# Patient Record
Sex: Female | Born: 1960 | Race: White | Hispanic: No | Smoking: Never smoker
Health system: Southern US, Community
[De-identification: ages and names within clinical notes are randomized; demographics above are authoritative.]

## PROBLEM LIST (undated history)

## (undated) DIAGNOSIS — I4510 Unspecified right bundle-branch block: Secondary | ICD-10-CM

## (undated) DIAGNOSIS — F419 Anxiety disorder, unspecified: Secondary | ICD-10-CM

## (undated) DIAGNOSIS — Z974 Presence of external hearing-aid: Secondary | ICD-10-CM

## (undated) DIAGNOSIS — F32A Depression, unspecified: Secondary | ICD-10-CM

## (undated) DIAGNOSIS — K2270 Barrett's esophagus without dysplasia: Secondary | ICD-10-CM

## (undated) DIAGNOSIS — I10 Essential (primary) hypertension: Secondary | ICD-10-CM

## (undated) DIAGNOSIS — C50919 Malignant neoplasm of unspecified site of unspecified female breast: Secondary | ICD-10-CM

## (undated) DIAGNOSIS — K219 Gastro-esophageal reflux disease without esophagitis: Secondary | ICD-10-CM

## (undated) HISTORY — PX: BREAST LUMPECTOMY: SHX2

---

## 2002-12-28 ENCOUNTER — Other Ambulatory Visit: Payer: Self-pay

## 2002-12-29 ENCOUNTER — Other Ambulatory Visit: Payer: Self-pay

## 2003-05-30 ENCOUNTER — Other Ambulatory Visit: Payer: Self-pay

## 2003-06-18 ENCOUNTER — Other Ambulatory Visit: Payer: Self-pay

## 2004-02-09 DIAGNOSIS — C50919 Malignant neoplasm of unspecified site of unspecified female breast: Secondary | ICD-10-CM

## 2004-02-09 HISTORY — PX: CERVICAL SPINE SURGERY: SHX589

## 2004-02-09 HISTORY — DX: Malignant neoplasm of unspecified site of unspecified female breast: C50.919

## 2004-02-11 ENCOUNTER — Ambulatory Visit: Payer: Self-pay | Admitting: Internal Medicine

## 2004-04-17 ENCOUNTER — Ambulatory Visit: Payer: Self-pay | Admitting: Internal Medicine

## 2004-04-23 ENCOUNTER — Ambulatory Visit: Payer: Self-pay | Admitting: Internal Medicine

## 2004-06-10 ENCOUNTER — Ambulatory Visit: Payer: Self-pay | Admitting: General Surgery

## 2004-06-12 ENCOUNTER — Ambulatory Visit: Payer: Self-pay | Admitting: Radiation Oncology

## 2004-08-05 ENCOUNTER — Ambulatory Visit: Payer: Self-pay | Admitting: General Surgery

## 2004-08-07 ENCOUNTER — Ambulatory Visit: Payer: Self-pay | Admitting: Radiation Oncology

## 2004-08-08 ENCOUNTER — Ambulatory Visit: Payer: Self-pay | Admitting: Radiation Oncology

## 2004-09-08 ENCOUNTER — Ambulatory Visit: Payer: Self-pay | Admitting: Radiation Oncology

## 2004-10-07 ENCOUNTER — Other Ambulatory Visit: Payer: Self-pay

## 2004-10-09 ENCOUNTER — Ambulatory Visit: Payer: Self-pay | Admitting: Radiation Oncology

## 2004-11-08 ENCOUNTER — Ambulatory Visit: Payer: Self-pay | Admitting: Radiation Oncology

## 2004-12-09 ENCOUNTER — Ambulatory Visit: Payer: Self-pay | Admitting: Radiation Oncology

## 2004-12-18 ENCOUNTER — Ambulatory Visit: Payer: Self-pay | Admitting: Internal Medicine

## 2005-01-08 ENCOUNTER — Ambulatory Visit: Payer: Self-pay | Admitting: Radiation Oncology

## 2005-01-08 ENCOUNTER — Inpatient Hospital Stay (HOSPITAL_COMMUNITY): Admission: RE | Admit: 2005-01-08 | Discharge: 2005-01-10 | Payer: Self-pay | Admitting: Neurosurgery

## 2005-02-19 ENCOUNTER — Ambulatory Visit: Payer: Self-pay | Admitting: Internal Medicine

## 2005-02-26 ENCOUNTER — Ambulatory Visit: Payer: Self-pay | Admitting: Radiation Oncology

## 2005-03-11 ENCOUNTER — Ambulatory Visit: Payer: Self-pay | Admitting: Internal Medicine

## 2005-04-08 ENCOUNTER — Ambulatory Visit: Payer: Self-pay | Admitting: Internal Medicine

## 2005-05-09 ENCOUNTER — Ambulatory Visit: Payer: Self-pay | Admitting: Internal Medicine

## 2005-06-08 ENCOUNTER — Ambulatory Visit: Payer: Self-pay | Admitting: Internal Medicine

## 2005-07-09 ENCOUNTER — Ambulatory Visit: Payer: Self-pay | Admitting: Internal Medicine

## 2005-08-08 ENCOUNTER — Ambulatory Visit: Payer: Self-pay | Admitting: Internal Medicine

## 2005-09-14 ENCOUNTER — Ambulatory Visit: Payer: Self-pay | Admitting: Internal Medicine

## 2005-10-09 ENCOUNTER — Ambulatory Visit: Payer: Self-pay | Admitting: Internal Medicine

## 2005-11-08 ENCOUNTER — Ambulatory Visit: Payer: Self-pay | Admitting: Internal Medicine

## 2005-12-14 ENCOUNTER — Ambulatory Visit: Payer: Self-pay | Admitting: Internal Medicine

## 2006-01-08 ENCOUNTER — Ambulatory Visit: Payer: Self-pay | Admitting: Internal Medicine

## 2006-02-08 ENCOUNTER — Ambulatory Visit: Payer: Self-pay | Admitting: Internal Medicine

## 2006-03-18 ENCOUNTER — Ambulatory Visit: Payer: Self-pay | Admitting: Internal Medicine

## 2006-04-09 ENCOUNTER — Ambulatory Visit: Payer: Self-pay | Admitting: Internal Medicine

## 2006-05-10 ENCOUNTER — Ambulatory Visit: Payer: Self-pay | Admitting: Internal Medicine

## 2006-06-09 ENCOUNTER — Ambulatory Visit: Payer: Self-pay | Admitting: Internal Medicine

## 2006-07-10 ENCOUNTER — Ambulatory Visit: Payer: Self-pay | Admitting: Internal Medicine

## 2006-08-03 ENCOUNTER — Ambulatory Visit: Payer: Self-pay | Admitting: Unknown Physician Specialty

## 2006-08-09 ENCOUNTER — Ambulatory Visit: Payer: Self-pay | Admitting: Internal Medicine

## 2006-09-09 ENCOUNTER — Ambulatory Visit: Payer: Self-pay | Admitting: Internal Medicine

## 2006-10-06 ENCOUNTER — Ambulatory Visit: Payer: Self-pay | Admitting: Internal Medicine

## 2006-10-10 ENCOUNTER — Ambulatory Visit: Payer: Self-pay | Admitting: Internal Medicine

## 2006-12-28 ENCOUNTER — Ambulatory Visit: Payer: Self-pay | Admitting: Internal Medicine

## 2007-01-09 ENCOUNTER — Ambulatory Visit: Payer: Self-pay | Admitting: Internal Medicine

## 2007-04-09 ENCOUNTER — Ambulatory Visit: Payer: Self-pay | Admitting: Internal Medicine

## 2007-04-12 ENCOUNTER — Ambulatory Visit: Payer: Self-pay | Admitting: Internal Medicine

## 2007-05-10 ENCOUNTER — Ambulatory Visit: Payer: Self-pay | Admitting: Internal Medicine

## 2007-06-21 ENCOUNTER — Ambulatory Visit: Payer: Self-pay | Admitting: Internal Medicine

## 2008-05-20 ENCOUNTER — Emergency Department: Payer: Self-pay | Admitting: Emergency Medicine

## 2008-09-11 ENCOUNTER — Ambulatory Visit: Payer: Self-pay

## 2011-01-06 IMAGING — CT CT HEAD WITHOUT CONTRAST
2 series · 16 of 30 positions shown, 20 images · non-contrast
Comparison: none

REASON FOR EXAM: headache, dizzy, hx breast ca
COMMENTS:   LMP: d/c after chemo

PROCEDURE:     CT  - CT HEAD WITHOUT CONTRAST  - May 20, 2008  [DATE]
RESULT:
HISTORY: Headache, breast cancer.
COMPARISON STUDIES:  MRI of brain of 08/03/2006.
PROCEDURE AND FINDINGS:  Nonenhanced head CT is obtained.
No intra-axial or extra-axial pathologic fluid or blood collections are
identified. No mass lesions are noted. There is no hydrocephalus. No acute
bony abnormality identified.

[Series 2: without · axial · non-contrast · 0.39mm/px · z∈[+670,+790]mm · 13 of 30 slices shown, 17 images]
[im 3/30  brain]
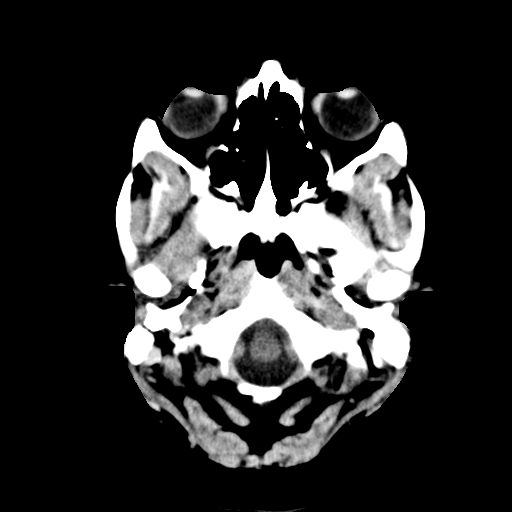
[im 3/30  bone]
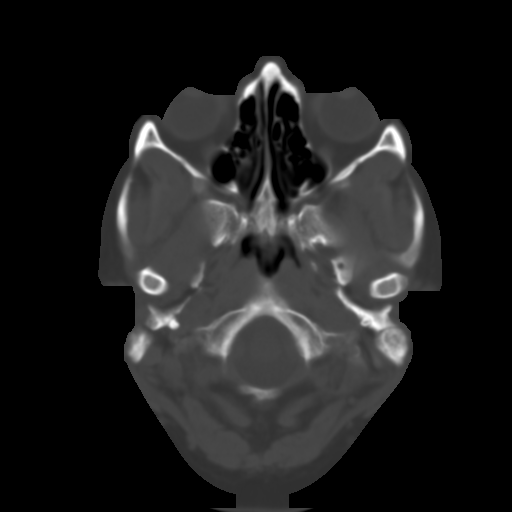
[im 5/30  brain]
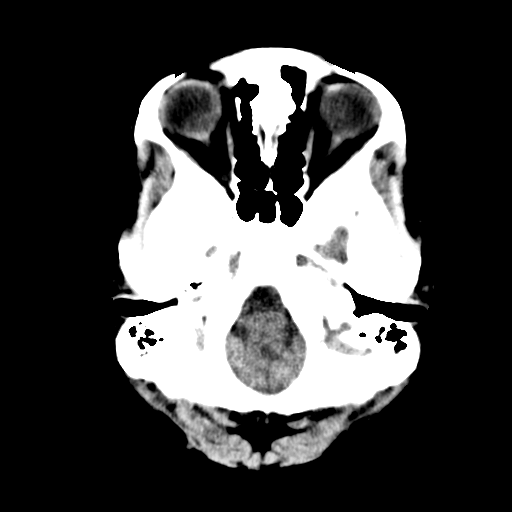
[im 7/30  brain]
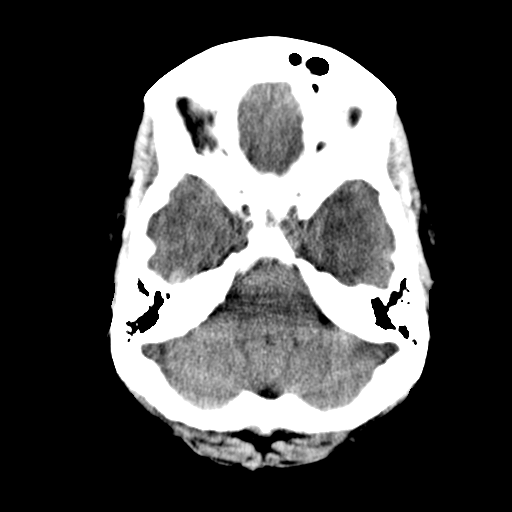
[im 9/30  brain]
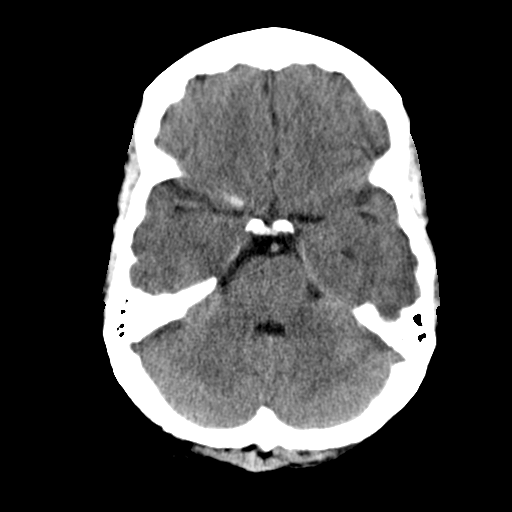
[im 11/30  brain]
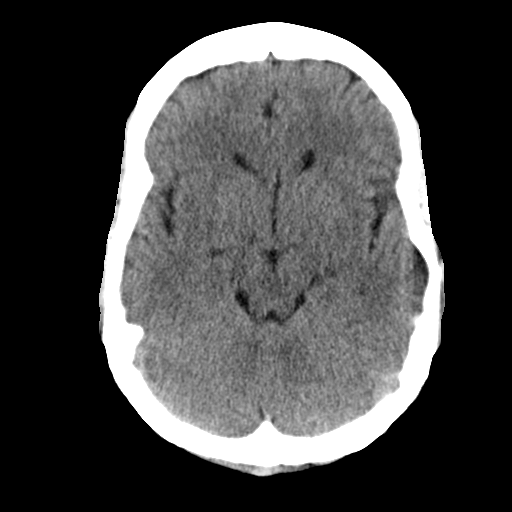
[im 11/30  bone]
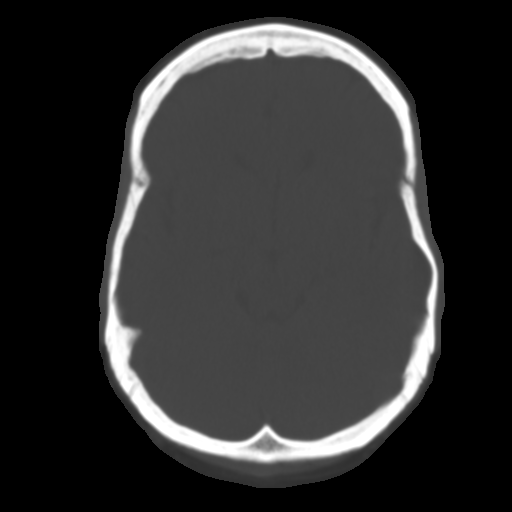
[im 13/30  brain]
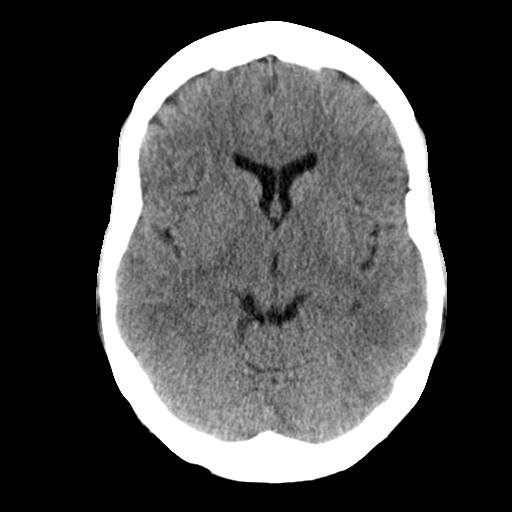
[im 15/30  brain]
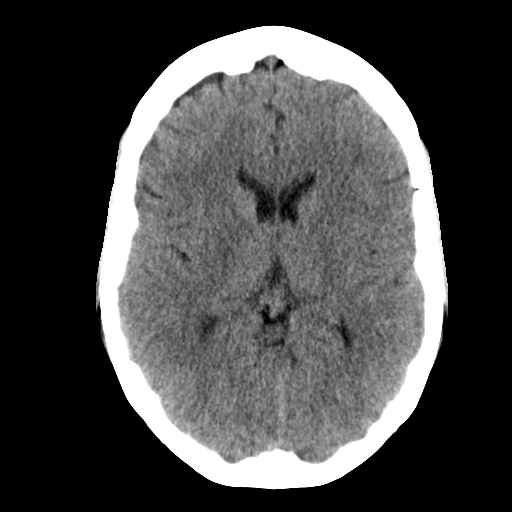
[im 17/30  brain]
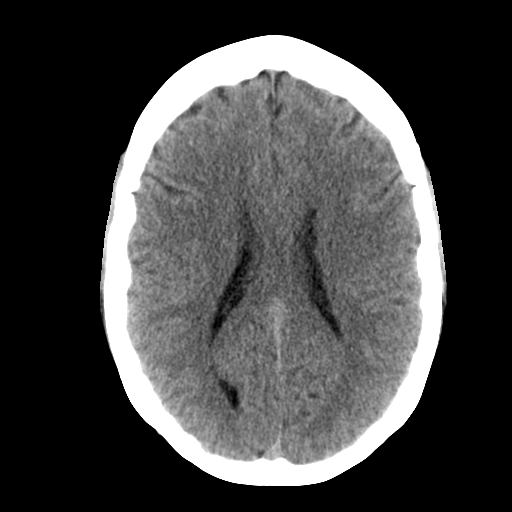
[im 19/30  brain]
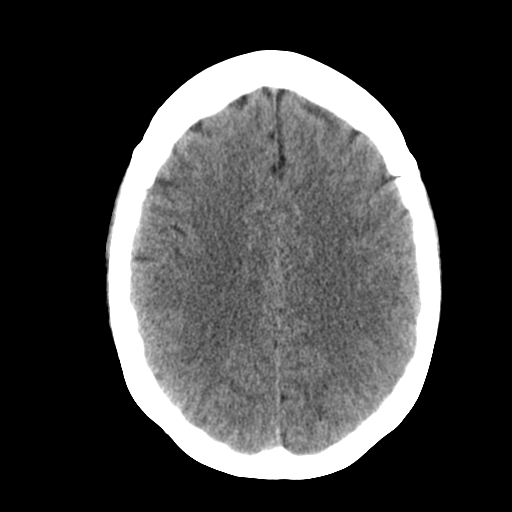
[im 19/30  bone]
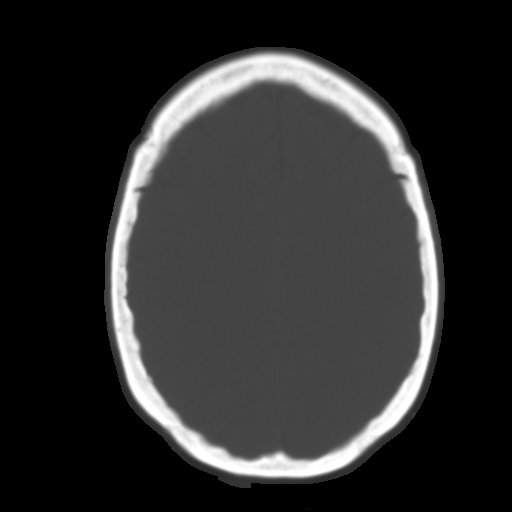
[im 21/30  brain]
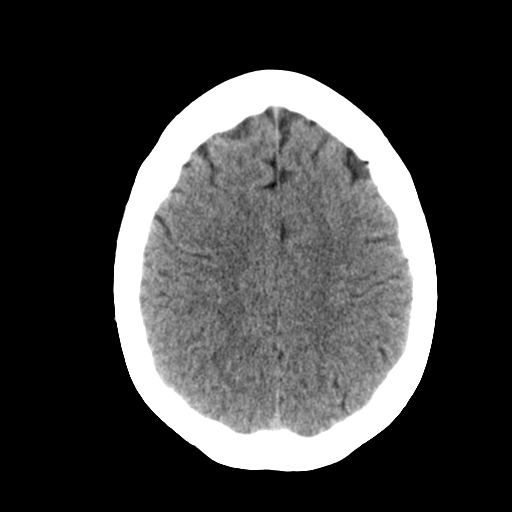
[im 23/30  brain]
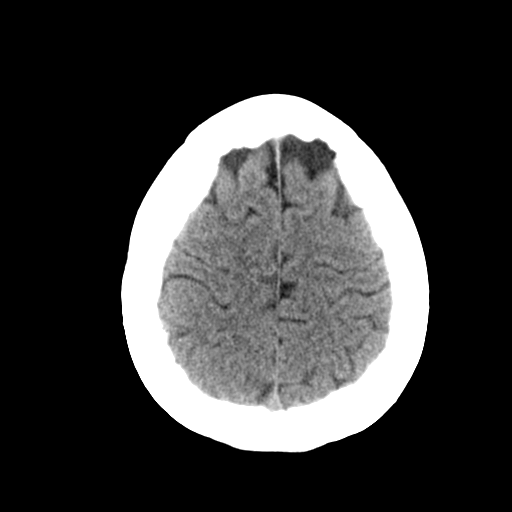
[im 25/30  brain]
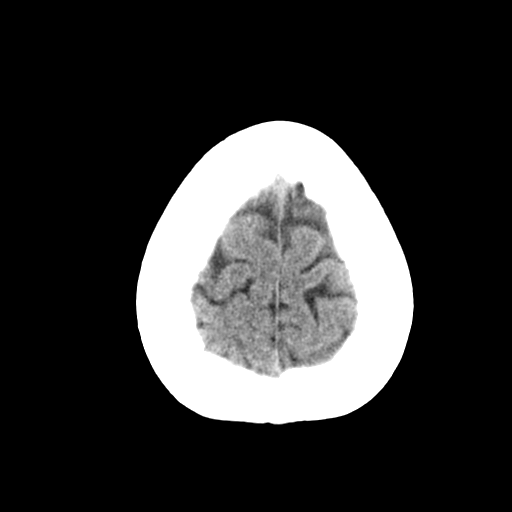
[im 27/30  brain]
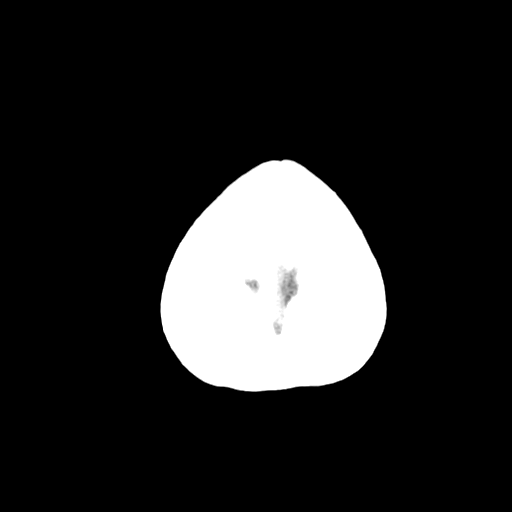
[im 27/30  bone]
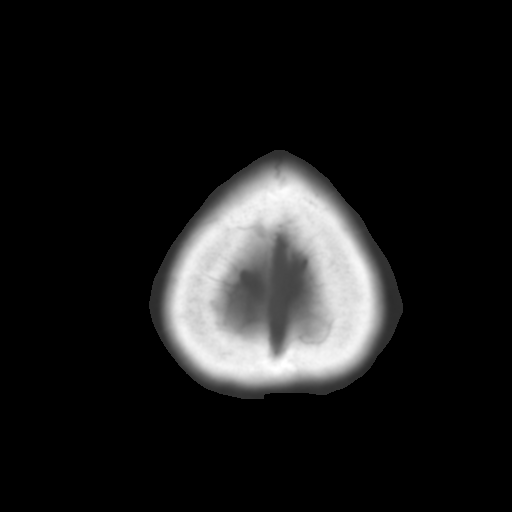

[Series 3: bone · axial · 0.39mm/px · z∈[+670,+710]mm · 3 of 30 slices shown]
[im 3/30  bone]
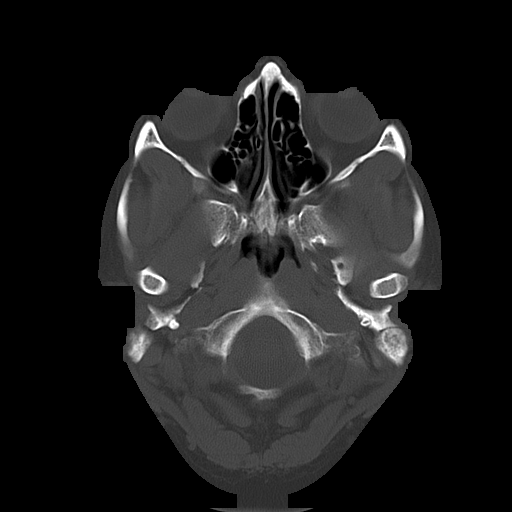
[im 7/30  bone]
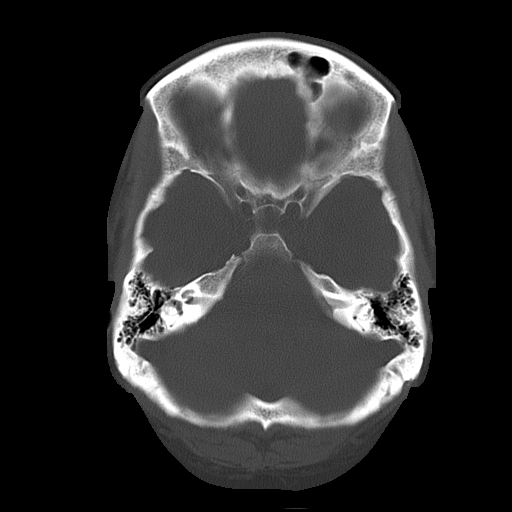
[im 11/30  bone]
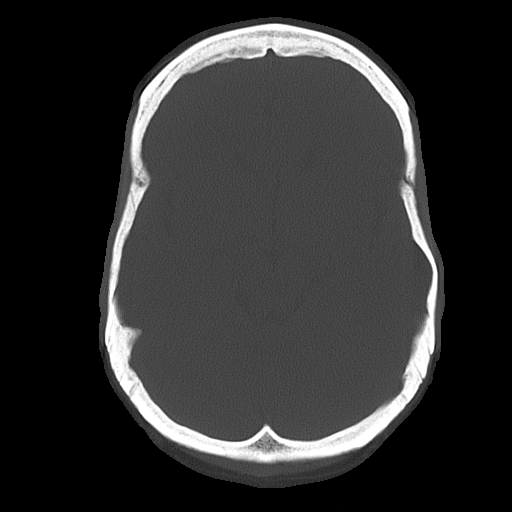

[16 of 30 positions shown; findings below may reference images not displayed]

IMPRESSION: No acute abnormality.

This report was phoned to the Emergency Room physician at the time of the
study.

## 2021-01-15 ENCOUNTER — Encounter: Payer: Self-pay | Admitting: Emergency Medicine

## 2021-01-15 ENCOUNTER — Ambulatory Visit
Admission: EM | Admit: 2021-01-15 | Discharge: 2021-01-15 | Disposition: A | Discharge status: Home / Self Care | Payer: Non-veteran care

## 2021-01-15 ENCOUNTER — Other Ambulatory Visit: Payer: Self-pay

## 2021-01-15 DIAGNOSIS — B349 Viral infection, unspecified: Secondary | ICD-10-CM | POA: Diagnosis not present

## 2021-01-15 DIAGNOSIS — R030 Elevated blood-pressure reading, without diagnosis of hypertension: Secondary | ICD-10-CM

## 2021-01-15 HISTORY — DX: Depression, unspecified: F32.A

## 2021-01-15 HISTORY — DX: Malignant neoplasm of unspecified site of unspecified female breast: C50.919

## 2021-01-15 HISTORY — DX: Anxiety disorder, unspecified: F41.9

## 2021-01-15 MED ORDER — BENZONATATE 100 MG PO CAPS: 100.0000 mg | ORAL_CAPSULE | Freq: Three times a day (TID) | ORAL | 0 refills | Status: DC | PRN

## 2021-01-15 NOTE — Discharge Instructions (Addendum)
Your COVID and Flu tests are pending.  You should self quarantine until the test results are back.  Take Tylenol as needed for fever or discomfort.  Rest and keep yourself hydrated.  Follow-up with your primary care provider if your symptoms are not improving.    Your blood pressure is elevated today at 167/86.  Please have this rechecked by your primary care provider in 2-4 weeks.

## 2021-01-15 NOTE — ED Triage Notes (Signed)
Pt c/o cough, chest congestion, ST, fatigue, and ears feel clogged. At home covid test was negative.

## 2021-01-15 NOTE — ED Provider Notes (Addendum)
[name]    CSN: [id number] Arrival date & time: 01/15/21  6599      History   Chief Complaint Chief Complaint  Patient presents with   Cough   Sore Throat    HPI Robin Zuniga is a 60 y.o. female.  Patient presents with fatigue, earache, congestion, postnasal drip, sore throat, cough x 3 days.  No fever, chills, drainage, shortness of breath, vomiting, diarrhea, or other symptoms.  Treatment at home with Tylenol.  Her medical history includes breast cancer, anxiety, depression.  The history is provided by the patient.   Past Medical History:  Diagnosis Date   Anxiety    Breast cancer ([place])    Depression     There are no problems to display for this patient.   Past Surgical History:  Procedure Laterality Date   BREAST LUMPECTOMY Right     OB History   No obstetric history on file.      Home Medications    Prior to Admission medications   Medication Sig Start Date End Date Taking? Authorizing Provider  ARIPiprazole (ABILIFY) 10 MG tablet TAKE ONE-HALF TABLET BY MOUTH DAILY FOR MOOD 05/13/20 05/14/21 Yes [provider]  benzonatate (TESSALON) 100 MG capsule Take 1 capsule (100 mg total) by mouth every 8 (eight) hours as needed for cough. 01/15/21  Yes Sharion Balloon, NP  buPROPion (WELLBUTRIN XL) 300 MG 24 hr tablet Take 1 tablet by mouth every morning. 05/19/20 01/29/21 Yes [provider]  busPIRone (BUSPAR) 10 MG tablet TAKE ONE TABLET BY MOUTH TWICE A DAY FOR ANXIETY 01/29/20 01/29/21 Yes [provider]  Carboxymethylcellulose Sodium 0.25 % SOLN INSTILL 1 DROP IN BOTH EYES FOUR TIMES A DAY AS NEEDED 08/01/20  Yes [provider]  clotrimazole (GYNE-LOTRIMIN) 1 % vaginal cream INSERT 1 APPLICATORFUL INTO VAGINA DAILY TO BE ADMINISTERED AT BEDTIME. 04/22/20  Yes [provider]  hydrOXYzine (ATARAX) 10 MG tablet TAKE ONE TABLET BY MOUTH DAILY AS NEEDED FOR ANXIETY 10/27/20 10/28/21 Yes [provider]   ibuprofen (ADVIL) 800 MG tablet TAKE ONE TABLET BY MOUTH THREE TIMES A DAY AS NEEDED FOR PAIN (TAKE WITH FOOD) 05/22/20  Yes [provider]  loratadine (CLARITIN) 10 MG tablet Take 1 tablet by mouth daily. 11/13/20  Yes [provider]  methylPREDNISolone (MEDROL DOSEPAK) 4 MG TBPK tablet TAKE TABLETS BY MOUTH AS DIRECTED PAIN ON PACKAGE INSTRUCTIONS 01/12/21  Yes [provider]  omeprazole (PRILOSEC) 40 MG capsule TAKE ONE CAPSULE BY MOUTH TWICE A DAY (TAKE ON AN EMPTY STOMACH 30 MINUTES PRIOR TO A MEAL) 09/17/20 02/26/21 Yes [provider]  traZODone (DESYREL) 50 MG tablet TAKE ONE TO TWO TABLETS BY MOUTH AT BEDTIME AS NEEDED FOR INSOMNIA 10/27/20 10/28/21 Yes [provider]    Family History No family history on file.  Social History Social History   Tobacco Use   Smoking status: Never   Smokeless tobacco: Never  Vaping Use   Vaping Use: Never used  Substance Use Topics   Alcohol use: Yes   Drug use: Never     Allergies   Cefazolin, Sulfa antibiotics, and Zonisamide   Review of Systems Review of Systems  Constitutional:  Negative for chills and fever.  HENT:  Positive for congestion, ear pain, postnasal drip and sore throat.   Respiratory:  Positive for cough. Negative for shortness of breath.   Cardiovascular:  Negative for chest pain and palpitations.  Gastrointestinal:  Negative for diarrhea and vomiting.  Skin:  Negative for color change and rash.  All other systems reviewed and are negative.   Physical Exam Triage Vital Signs ED Triage Vitals  Enc Vitals Group     BP      Pulse      Resp      Temp      Temp src      SpO2      Weight      Height      Head Circumference      Peak Flow      Pain Score      Pain Loc      Pain Edu?      Excl. in [place]?    No data found.  Updated Vital Signs BP (!) 167/86 (BP Location: Left Arm)   Pulse 95   Temp 98.7 F (37.1 C) (Oral)   Resp 18   SpO2 96%   Visual  Acuity Right Eye Distance:   Left Eye Distance:   Bilateral Distance:    Right Eye Near:   Left Eye Near:    Bilateral Near:     Physical Exam Vitals and nursing note reviewed.  Constitutional:      General: She is not in acute distress.    Appearance: She is well-developed.  HENT:     Right Ear: Tympanic membrane normal.     Left Ear: Tympanic membrane normal.     Nose: Nose normal.     Mouth/Throat:     Mouth: Mucous membranes are moist.     Pharynx: Oropharynx is clear.  Cardiovascular:     Rate and Rhythm: Normal rate and regular rhythm.     Heart sounds: Normal heart sounds.  Pulmonary:     Effort: Pulmonary effort is normal. No respiratory distress.     Breath sounds: Normal breath sounds.  Musculoskeletal:     Cervical back: Neck supple.  Skin:    General: Skin is warm and dry.  Neurological:     Mental Status: She is alert.  Psychiatric:        Mood and Affect: Mood normal.        Behavior: Behavior normal.     UC Treatments / Results  Labs (all labs ordered are listed, but only abnormal results are displayed) Labs Reviewed  COVID-19, FLU A+B NAA    EKG   Radiology No results found.  Procedures Procedures (including critical care time)  Medications Ordered in UC Medications - No data to display  Initial Impression / Assessment and Plan / UC Course  I have reviewed the triage vital signs and the nursing notes.  Pertinent labs & imaging results that were available during my care of the patient were reviewed by me and considered in my medical decision making (see chart for details).   Viral illness. Elevated blood pressure reading. COVID and Flu pending.  Instructed patient to self quarantine per CDC guidelines.  Treating cough with Tessalon Perles.  Discussed symptomatic treatment including Tylenol, rest, hydration.  Instructed patient to follow up with PCP if symptoms are not improving.  Also discussed that her blood pressure is elevated today and  needs to be rechecked by her PCP in 2 to 4 weeks.  Patient agrees to plan of care.    Final Clinical Impressions(s) / UC Diagnoses   Final diagnoses:  Viral illness  Elevated blood pressure reading     Discharge Instructions      Your COVID and Flu tests are pending.  You should self quarantine until the test results are back.  Take Tylenol as needed for fever or discomfort.  Rest and keep yourself hydrated.  Follow-up with your primary care provider if your symptoms are not improving.    Your blood pressure is elevated today at 167/86.  Please have this rechecked by your primary care provider in 2-4 weeks.          ED Prescriptions     Medication Sig Dispense Auth. Provider   benzonatate (TESSALON) 100 MG capsule Take 1 capsule (100 mg total) by mouth every 8 (eight) hours as needed for cough. 21 capsule Sharion Balloon, NP      PDMP not reviewed this encounter.   Sharion Balloon, NP 01/15/21 1025    Sharion Balloon, NP 01/15/21 1027

## 2021-01-16 LAB — COVID-19, FLU A+B NAA
Influenza A, NAA: NOT DETECTED
Influenza B, NAA: NOT DETECTED
SARS-CoV-2, NAA: NOT DETECTED

## 2021-12-14 ENCOUNTER — Other Ambulatory Visit: Payer: Self-pay

## 2021-12-14 ENCOUNTER — Emergency Department
Admission: EM | Admit: 2021-12-14 | Discharge: 2021-12-14 | Disposition: A | Discharge status: Home / Self Care | Payer: No Typology Code available for payment source | Attending: Emergency Medicine | Admitting: Emergency Medicine

## 2021-12-14 ENCOUNTER — Emergency Department: Payer: No Typology Code available for payment source

## 2021-12-14 ENCOUNTER — Ambulatory Visit
Admission: EM | Admit: 2021-12-14 | Discharge: 2021-12-14 | Disposition: A | Discharge status: Other Acute Inpatient Hospital | Payer: Non-veteran care

## 2021-12-14 ENCOUNTER — Encounter: Payer: Self-pay | Admitting: *Deleted

## 2021-12-14 DIAGNOSIS — I10 Essential (primary) hypertension: Secondary | ICD-10-CM | POA: Diagnosis not present

## 2021-12-14 DIAGNOSIS — R519 Headache, unspecified: Secondary | ICD-10-CM | POA: Diagnosis present

## 2021-12-14 LAB — CBC WITH DIFFERENTIAL/PLATELET
Abs Immature Granulocytes: 0.02 10*3/uL (ref 0.00–0.07)
Basophils Absolute: 0.1 10*3/uL (ref 0.0–0.1)
Basophils Relative: 1 %
Eosinophils Absolute: 0.5 10*3/uL (ref 0.0–0.5)
Eosinophils Relative: 7 %
HCT: 42.1 % (ref 36.0–46.0)
Hemoglobin: 14.3 g/dL (ref 12.0–15.0)
Immature Granulocytes: 0 %
Lymphocytes Relative: 25 %
Lymphs Abs: 2 10*3/uL (ref 0.7–4.0)
MCH: 29.1 pg (ref 26.0–34.0)
MCHC: 34 g/dL (ref 30.0–36.0)
MCV: 85.7 fL (ref 80.0–100.0)
Monocytes Absolute: 0.5 10*3/uL (ref 0.1–1.0)
Monocytes Relative: 6 %
Neutro Abs: 4.8 10*3/uL (ref 1.7–7.7)
Neutrophils Relative %: 61 %
Platelets: 235 10*3/uL (ref 150–400)
RBC: 4.91 MIL/uL (ref 3.87–5.11)
RDW: 12.6 % (ref 11.5–15.5)
WBC: 8 10*3/uL (ref 4.0–10.5)
nRBC: 0 % (ref 0.0–0.2)

## 2021-12-14 LAB — COMPREHENSIVE METABOLIC PANEL
ALT: 16 U/L (ref 0–44)
AST: 15 U/L (ref 15–41)
Albumin: 4.4 g/dL (ref 3.5–5.0)
Alkaline Phosphatase: 86 U/L (ref 38–126)
Anion gap: 6 (ref 5–15)
BUN: 15 mg/dL (ref 6–20)
CO2: 28 mmol/L (ref 22–32)
Calcium: 9.1 mg/dL (ref 8.9–10.3)
Chloride: 107 mmol/L (ref 98–111)
Creatinine, Ser: 0.77 mg/dL (ref 0.44–1.00)
GFR, Estimated: >60 mL/min (ref >60)
Glucose, Bld: 115 mg/dL — ABNORMAL HIGH (ref 70–99)
Potassium: 3.9 mmol/L (ref 3.5–5.1)
Sodium: 141 mmol/L (ref 135–145)
Total Bilirubin: 0.5 mg/dL (ref 0.3–1.2)
Total Protein: 7.2 g/dL (ref 6.5–8.1)

## 2021-12-14 LAB — TROPONIN I (HIGH SENSITIVITY)
Troponin I (High Sensitivity): 6 ng/L (ref <18)
Troponin I (High Sensitivity): 6 ng/L (ref <18)

## 2021-12-14 MED ORDER — LOSARTAN POTASSIUM 25 MG PO TABS: 25.0000 mg | ORAL_TABLET | Freq: Two times a day (BID) | ORAL | 1 refills | Status: AC

## 2021-12-14 MED ORDER — ACETAMINOPHEN 500 MG PO TABS
1000.0000 mg | ORAL_TABLET | Freq: Once | ORAL | Status: AC
  Administered 2021-12-14: 1000 mg via ORAL
  Filled 2021-12-14: qty 2

## 2021-12-14 MED ORDER — LOSARTAN POTASSIUM 50 MG PO TABS
25.0000 mg | ORAL_TABLET | Freq: Once | ORAL | Status: AC
  Administered 2021-12-14: 25 mg via ORAL
  Filled 2021-12-14: qty 1

## 2021-12-14 MED ORDER — LOSARTAN POTASSIUM 25 MG PO TABS: 25.0000 mg | ORAL_TABLET | Freq: Two times a day (BID) | ORAL | 1 refills | Status: DC

## 2021-12-14 NOTE — ED Provider Triage Note (Signed)
Emergency Medicine Provider Triage Evaluation Note  Robin Zuniga , a 61 y.o. female  was evaluated in triage.  Pt complains of hypertension. She was advised to come to the ER by primary care. She has had a headache and some chest fluttering. She has been under quite a bit of stress. Losartan was increased a few weeks ago, but it's not working.  Physical Exam  BP (!) 184/99 (BP Location: Left Arm)   Pulse 90   Temp 98.8 F (37.1 C) (Oral)   Resp 18  Gen:   Awake, no distress   Resp:  Normal effort  MSK:   Moves extremities without difficulty  Other:    Medical Decision Making  Medically screening exam initiated at 6:14 PM.  Appropriate orders placed.  Robin Zuniga was informed that the remainder of the evaluation will be completed by another provider, this initial triage assessment does not replace that evaluation, and the importance of remaining in the ED until their evaluation is complete.    [name], FNP 12/14/21 1906

## 2021-12-14 NOTE — ED Provider Notes (Addendum)
[facility] Provider Note    Event Date/Time   First MD Initiated Contact with Patient 12/14/21 2105     (approximate)   History   Hypertension   HPI  Robin Zuniga is a 61 y.o. female with history of hypertension who comes in with concerns for hypertension.  She reports being told to come to the ER due to some chest fluttering and headache.  She reports being under a lot of stress recently.  She reports that her losartan was recently increased but it is not working.  Patient reports that it went from 12.5 to 25 mg about 3 weeks ago but she still been having elevated blood pressures in the 150s to 160s.  She reported that today she had a slight tension-like headache on the right side of her head as well as some fluttering not really a heart attack just a little chest discomfort in her chest before coming in.  She denies any significant pain at this time.  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 --      Excl. in [place]? --     Most recent vital signs: Vitals:   12/14/21 1811  BP: (!) 184/99  Pulse: 90  Resp: 18  Temp: 98.8 F (37.1 C)     General: Awake, no distress.  CV:  Good peripheral perfusion.  Resp:  Normal effort.  Abd:  No distention.  Soft and nontender Other:  Cranial nerves II to XII are intact.  Equal strength in arms and legs.   ED Results  / Procedures / Treatments   Labs (all labs ordered are listed, but only abnormal results are displayed) Labs Reviewed  COMPREHENSIVE METABOLIC PANEL - Abnormal; Notable for the following components:      Result Value   Glucose, Bld 115 (*)    All other components within normal limits  CBC WITH DIFFERENTIAL/PLATELET  TROPONIN I (HIGH SENSITIVITY)  TROPONIN I (HIGH SENSITIVITY)     EKG  My interpretation of EKG:  Normal sinus rate of 74 without any ST elevation or T wave inversions, incomplete right bundle branch block.  RADIOLOGY I have reviewed the CT head personally and interpreted no evidence of intracranial hemorrhage   PROCEDURES:  Critical Care performed: No  Procedures   MEDICATIONS ORDERED IN ED: Medications - No data to display   IMPRESSION / MDM / [place] / ED COURSE  I reviewed the triage vital signs and the nursing notes.   Patient's presentation is most consistent with acute, uncomplicated illness.  Differential includes intracranial hemorrhage, ACS, anemia, AKI.  Blood patient's blood pressure is elevated she is on losartan 25 mg twice daily.  Discussed with pharmacy, Corene Cornea and patient can go up to 50 mg twice daily.  Discussed this with patient that if her blood pressures remain significantly elevated in the 170s or 80s they can trial this at home.  She is almost out of her prescription will so we will prescribe some more losartan to take and she can follow-up with her primary care doctor.  Patient does report a lot of stress that could also be related to that.   Troponin was negative.  CBC reassuring CMP reassuring.  Discussed getting a chest x-ray with patient but she reports no chest pain or shortness of breath and declines.  Do not feel patient has a dissection she is very well-appearing ambulating around the room and reports being asymptomatic on repeat evaluation.  Oxygen is 98% unclear why it was not charted but I checked it on the  pulse ox myself to ensure it was normal   FINAL CLINICAL IMPRESSION(S) / ED DIAGNOSES   Final diagnoses:  Uncontrolled hypertension     Rx / DC Orders   ED Discharge Orders          Ordered    losartan (COZAAR) 25 MG tablet  2 times daily,   Status:  Discontinued        12/14/21 2242    losartan (COZAAR) 25 MG tablet  2 times daily        12/14/21 2242             Note:  This document was prepared using Dragon voice recognition software and may include unintentional dictation errors.   Vanessa Hope, MD 12/14/21 2243    Vanessa Joes, MD 12/14/21 2245

## 2021-12-14 NOTE — ED Triage Notes (Addendum)
Here for HTN: Took BP today, was elevated, [facility] then [facility] to [facility], and was instructed to come to ED, c/o HA, and had 1 flutter episode in chest. Endorses lots of stress, and dry cough recently. Losartan increased ~3 weeks ago. Denies fever, sob, NVD, light headedness, dizziness, syncope.

## 2021-12-14 NOTE — Discharge Instructions (Addendum)
I have written you a new prescription for your losartan.  You are currently taking 25 mg twice daily.  If your blood pressure is remaining elevated in the 170s to 180s and not getting better you can start taking 2 pills or 50 mg twice daily until you get follow up with the [place] should follow-up with your primary care doctor at the [facility] to have your blood pressure rechecked to see if they need to add additional medication.  Return the ER if develop worsening symptoms or any other concerns

## 2022-09-06 HISTORY — PX: COLONOSCOPY WITH ESOPHAGOGASTRODUODENOSCOPY (EGD): SHX5779

## 2022-09-29 ENCOUNTER — Encounter: Payer: Self-pay | Admitting: Ophthalmology

## 2022-10-01 NOTE — Anesthesia Preprocedure Evaluation (Signed)
Anesthesia Evaluation  Patient identified by MRN, date of birth, ID band Patient awake    Reviewed: Allergy & Precautions, H&P , NPO status , Patient's Chart, lab work & pertinent test results  Airway Mallampati: II  TM Distance: >3 FB Neck ROM: Full    Dental no notable dental hx.    Pulmonary neg pulmonary ROS   Pulmonary exam normal breath sounds clear to auscultation       Cardiovascular hypertension, Normal cardiovascular exam+ dysrhythmias  Rhythm:Regular Rate:Normal     Neuro/Psych  PSYCHIATRIC DISORDERS Anxiety Depression    negative neurological ROS     GI/Hepatic Neg liver ROS,GERD  ,,  Endo/Other  negative endocrine ROS    Renal/GU negative Renal ROS  negative genitourinary   Musculoskeletal negative musculoskeletal ROS (+)    Abdominal   Peds negative pediatric ROS (+)  Hematology negative hematology ROS (+)   Anesthesia Other Findings Depression  Anxiety Breast cancer (HCC)  Hypertension GERD (gastroesophageal reflux disease) Wears hearing aid in right ear RBBB (right bundle branch block)  Barrett's esophagus    Reproductive/Obstetrics negative OB ROS                             Anesthesia Physical Anesthesia Plan  ASA: 3  Anesthesia Plan: MAC   Post-op Pain Management:    Induction: Intravenous  PONV Risk Score and Plan:   Airway Management Planned: Natural Airway and Nasal Cannula  Additional Equipment:   Intra-op Plan:   Post-operative Plan:   Informed Consent: I have reviewed the patients History and Physical, chart, labs and discussed the procedure including the risks, benefits and alternatives for the proposed anesthesia with the patient or authorized representative who has indicated his/her understanding and acceptance.     Dental Advisory Given  Plan Discussed with: Anesthesiologist, CRNA and Surgeon  Anesthesia Plan Comments: (Patient  consented for risks of anesthesia including but not limited to:  - adverse reactions to medications - damage to eyes, teeth, lips or other oral mucosa - nerve damage due to positioning  - sore throat or hoarseness - Damage to heart, brain, nerves, lungs, other parts of body or loss of life  Patient voiced understanding.)        Anesthesia Quick Evaluation

## 2022-10-05 NOTE — Discharge Instructions (Signed)

## 2022-10-07 ENCOUNTER — Other Ambulatory Visit: Payer: Self-pay

## 2022-10-07 ENCOUNTER — Ambulatory Visit
Admission: RE | Admit: 2022-10-07 | Discharge: 2022-10-07 | Disposition: A | Discharge status: Home / Self Care | Payer: No Typology Code available for payment source | Attending: Ophthalmology | Admitting: Ophthalmology

## 2022-10-07 ENCOUNTER — Ambulatory Visit: Payer: No Typology Code available for payment source | Admitting: Anesthesiology

## 2022-10-07 ENCOUNTER — Encounter: Payer: Self-pay | Admitting: Ophthalmology

## 2022-10-07 ENCOUNTER — Encounter
Admission: RE | Disposition: A | Discharge status: Home / Self Care | Payer: Self-pay | Source: Home / Self Care | Attending: Ophthalmology

## 2022-10-07 DIAGNOSIS — Z853 Personal history of malignant neoplasm of breast: Secondary | ICD-10-CM | POA: Insufficient documentation

## 2022-10-07 DIAGNOSIS — I4510 Unspecified right bundle-branch block: Secondary | ICD-10-CM | POA: Diagnosis not present

## 2022-10-07 DIAGNOSIS — K219 Gastro-esophageal reflux disease without esophagitis: Secondary | ICD-10-CM | POA: Diagnosis not present

## 2022-10-07 DIAGNOSIS — I10 Essential (primary) hypertension: Secondary | ICD-10-CM | POA: Insufficient documentation

## 2022-10-07 DIAGNOSIS — H2512 Age-related nuclear cataract, left eye: Secondary | ICD-10-CM | POA: Insufficient documentation

## 2022-10-07 HISTORY — DX: Barrett's esophagus without dysplasia: K22.70

## 2022-10-07 HISTORY — DX: Gastro-esophageal reflux disease without esophagitis: K21.9

## 2022-10-07 HISTORY — DX: Unspecified right bundle-branch block: I45.10

## 2022-10-07 HISTORY — DX: Presence of external hearing-aid: Z97.4

## 2022-10-07 HISTORY — DX: Essential (primary) hypertension: I10

## 2022-10-07 HISTORY — PX: CATARACT EXTRACTION W/PHACO: SHX586

## 2022-10-07 SURGERY — PHACOEMULSIFICATION, CATARACT, WITH IOL INSERTION
Anesthesia: Monitor Anesthesia Care | Site: Eye | Laterality: Left

## 2022-10-07 MED ORDER — SIGHTPATH DOSE#1 BSS IO SOLN
INTRAOCULAR | Status: DC | PRN
  Administered 2022-10-07: 15 mL

## 2022-10-07 MED ORDER — LIDOCAINE HCL (PF) 2 % IJ SOLN
INTRAOCULAR | Status: DC | PRN
  Administered 2022-10-07: 1 mL via INTRAOCULAR

## 2022-10-07 MED ORDER — FENTANYL CITRATE (PF) 100 MCG/2ML IJ SOLN
INTRAMUSCULAR | Status: DC | PRN
  Administered 2022-10-07 (×2): 50 ug via INTRAVENOUS

## 2022-10-07 MED ORDER — MIDAZOLAM HCL 5 MG/5ML IJ SOLN
INTRAMUSCULAR | Status: DC | PRN
  Administered 2022-10-07 (×2): 1 mg via INTRAVENOUS

## 2022-10-07 MED ORDER — BRIMONIDINE TARTRATE-TIMOLOL 0.2-0.5 % OP SOLN
OPHTHALMIC | Status: DC | PRN
  Administered 2022-10-07: 1 [drp] via OPHTHALMIC

## 2022-10-07 MED ORDER — SIGHTPATH DOSE#1 NA HYALUR & NA CHOND-NA HYALUR IO KIT
PACK | INTRAOCULAR | Status: DC | PRN
  Administered 2022-10-07: 1 via OPHTHALMIC

## 2022-10-07 MED ORDER — TETRACAINE HCL 0.5 % OP SOLN
1.0000 [drp] | OPHTHALMIC | Status: DC | PRN
  Administered 2022-10-07 (×3): 1 [drp] via OPHTHALMIC

## 2022-10-07 MED ORDER — SIGHTPATH DOSE#1 BSS IO SOLN
INTRAOCULAR | Status: DC | PRN
  Administered 2022-10-07: 90 mL via OPHTHALMIC

## 2022-10-07 MED ORDER — LACTATED RINGERS IV SOLN: INTRAVENOUS | Status: DC

## 2022-10-07 MED ORDER — MOXIFLOXACIN HCL 0.5 % OP SOLN
OPHTHALMIC | Status: DC | PRN
  Administered 2022-10-07: .2 mL via OPHTHALMIC

## 2022-10-07 MED ORDER — ARMC OPHTHALMIC DILATING DROPS
1.0000 | OPHTHALMIC | Status: DC | PRN
  Administered 2022-10-07 (×3): 1 via OPHTHALMIC

## 2022-10-07 SURGICAL SUPPLY — 9 items
CATARACT SUITE SIGHTPATH (MISCELLANEOUS) ×1
DISSECTOR HYDRO NUCLEUS 50X22 (MISCELLANEOUS) ×2 IMPLANT
DRSG TEGADERM 2-3/8X2-3/4 SM (GAUZE/BANDAGES/DRESSINGS) ×2 IMPLANT
FEE CATARACT SUITE SIGHTPATH (MISCELLANEOUS) ×2 IMPLANT
GLOVE SURG SYN 7.5 E (GLOVE) ×1
GLOVE SURG SYN 7.5 PF PI (GLOVE) ×2 IMPLANT
GLOVE SURG SYN 8.5 E (GLOVE) ×1
GLOVE SURG SYN 8.5 PF PI (GLOVE) ×2 IMPLANT
LENS IOL TECNIS EYHANCE 20.0 (Intraocular Lens) IMPLANT

## 2022-10-07 NOTE — Op Note (Signed)
OPERATIVE NOTE  Robin Zuniga [facility] [id number] 10/07/2022   PREOPERATIVE DIAGNOSIS: Nuclear sclerotic cataract left eye. H25.12   POSTOPERATIVE DIAGNOSIS: Nuclear sclerotic cataract left eye. H25.12   PROCEDURE:  Phacoemusification with posterior chamber intraocular lens placement of the left eye  Ultrasound time: Procedure(s): CATARACT EXTRACTION PHACO AND INTRAOCULAR LENS PLACEMENT (IOC) LEFT  8.37  00:57.6 (Left)  LENS:   Implant Name Type Inv. Item Serial No. Manufacturer Lot No. LRB No. Used Action  LENS IOL TECNIS EYHANCE 20.0 - [id number] Intraocular Lens LENS IOL TECNIS EYHANCE 20.0 [id number] [employer]  Left 1 Implanted      SURGEON:  Julious Payer. Rolley Sims, MD   ANESTHESIA:  Topical with tetracaine drops, augmented with 1% preservative-free intracameral lidocaine.   COMPLICATIONS:  None.   DESCRIPTION OF PROCEDURE:  The patient was identified in the holding room and transported to the operating room and placed in the supine position under the operating microscope.  The left eye was identified as the operative eye, which was prepped and draped in the usual sterile ophthalmic fashion.   A 1 millimeter clear-corneal paracentesis was made inferotemporally. Preservative-free 1% lidocaine mixed with 1:1,000 bisulfite-free aqueous solution of epinephrine was injected into the anterior chamber. The anterior chamber was then filled with Viscoat viscoelastic. A 2.4 millimeter keratome was used to make a clear-corneal incision superotemporally. A curvilinear capsulorrhexis was made with a cystotome and capsulorrhexis forceps. Balanced salt solution was used to hydrodissect and hydrodelineate the nucleus. Phacoemulsification was then used to remove the lens nucleus and epinucleus. The remaining cortex was then removed using the irrigation and aspiration handpiece. Provisc was then placed into the capsular bag to distend it for lens placement. An +20.00 D DIB00 intraocular lens was then injected into  the capsular bag. The remaining viscoelastic was aspirated.   Wounds were hydrated with balanced salt solution.  The anterior chamber was inflated to a physiologic pressure with balanced salt solution.  No wound leaks were noted. Vigamox was injected intracamerally.  Timolol and Brimonidine drops were applied to the eye.  The patient was taken to the recovery room in stable condition without complications of anesthesia or surgery.  Rolly Pancake [place] 10/07/2022, 12:53 PM

## 2022-10-07 NOTE — Anesthesia Postprocedure Evaluation (Signed)
Anesthesia Post Note  Patient: Robin Zuniga [facility]  Procedure(s) Performed: CATARACT EXTRACTION PHACO AND INTRAOCULAR LENS PLACEMENT (IOC) LEFT  8.37  00:57.6 (Left: Eye)  Patient location during evaluation: PACU Anesthesia Type: MAC Level of consciousness: awake and alert Pain management: pain level controlled Vital Signs Assessment: post-procedure vital signs reviewed and stable Respiratory status: spontaneous breathing, nonlabored ventilation, respiratory function stable and patient connected to nasal cannula oxygen Cardiovascular status: stable and blood pressure returned to baseline Postop Assessment: no apparent nausea or vomiting Anesthetic complications: no   No notable events documented.   Last Vitals:  Vitals:   10/07/22 1255 10/07/22 1259  BP: 131/63 128/76  Pulse: 60 77  Resp: 18 17  Temp: 36.5 C (!) 36.4 C  SpO2: 99% 100%    Last Pain:  Vitals:   10/07/22 1259  TempSrc:   PainSc: 0-No pain                 Robin Zuniga C Mako Pelfrey

## 2022-10-07 NOTE — Transfer of Care (Signed)
Immediate Anesthesia Transfer of Care Note  Patient: Robin Zuniga [facility]  Procedure(s) Performed: CATARACT EXTRACTION PHACO AND INTRAOCULAR LENS PLACEMENT (IOC) LEFT  8.37  00:57.6 (Left: Eye)  Patient Location: PACU  Anesthesia Type: MAC  Level of Consciousness: awake, alert  and patient cooperative  Airway and Oxygen Therapy: Patient Spontanous Breathing and Patient connected to supplemental oxygen  Post-op Assessment: Post-op Vital signs reviewed, Patient's Cardiovascular Status Stable, Respiratory Function Stable, Patent Airway and No signs of Nausea or vomiting  Post-op Vital Signs: Reviewed and stable  Complications: No notable events documented.

## 2022-10-07 NOTE — H&P (Signed)
[facility]   Primary Care Physician:  [facility] [place] [facility] Ophthalmologist: Dr. Deberah Pelton  Pre-Procedure History & Physical: HPI:  Robin Zuniga is a 62 y.o. female here for cataract surgery.   Past Medical History:  Diagnosis Date   Anxiety    Barrett's esophagus    Breast cancer (HCC) 2006   Depression    GERD (gastroesophageal reflux disease)    Hypertension    RBBB (right bundle branch block)    Wears hearing aid in right ear     Past Surgical History:  Procedure Laterality Date   BREAST LUMPECTOMY Right    CERVICAL SPINE SURGERY  2006   COLONOSCOPY WITH ESOPHAGOGASTRODUODENOSCOPY (EGD)  09/06/2022   [facility] Administration    Prior to Admission medications   Medication Sig Start Date End Date Taking? Authorizing Provider  acetaminophen (TYLENOL) 500 MG tablet Take 1,000 mg by mouth 2 (two) times daily.   Yes [provider]  amLODipine (NORVASC) 10 MG tablet Take 10 mg by mouth at bedtime.   Yes [provider]  ARIPiprazole (ABILIFY) 10 MG tablet TAKE ONE-HALF TABLET BY MOUTH DAILY FOR MOOD 05/13/20 09/29/22 Yes [provider]  buPROPion (WELLBUTRIN XL) 300 MG 24 hr tablet Take 1 tablet by mouth every morning. 05/19/20 09/29/22 Yes [provider]  busPIRone (BUSPAR) 10 MG tablet Take 10 mg by mouth 2 (two) times daily.   Yes [provider]  Carboxymethylcellulose Sodium 0.25 % SOLN INSTILL 1 DROP IN BOTH EYES FOUR TIMES A DAY AS NEEDED 08/01/20  Yes [provider]  carvedilol (COREG) 6.25 MG tablet Take 3.125 mg by mouth 2 (two) times daily with a meal.   Yes [provider]  cetirizine (ZYRTEC) 10 MG chewable tablet Chew 10 mg by mouth at bedtime as needed for allergies.   Yes [provider]  famotidine (PEPCID) 20 MG tablet Take 20 mg by mouth 2 (two) times daily.   Yes [provider]  hydrOXYzine (ATARAX) 10 MG tablet Take 10 mg by mouth at bedtime as needed.   Yes [provider]  losartan (COZAAR) 25 MG tablet Take 1 tablet (25 mg total) by mouth in the morning and at bedtime. Patient taking differently: Take 100 mg by mouth in the morning and at bedtime. 12/14/21 09/29/22 Yes Concha Se, MD  Multiple Vitamin (MULTIVITAMIN) tablet Take 1 tablet by mouth daily.   Yes [provider]  Omega-3 Fatty Acids (FISH OIL PO) Take by mouth daily.   Yes [provider]  omeprazole (PRILOSEC) 40 MG capsule TAKE ONE CAPSULE BY MOUTH TWICE A DAY (TAKE ON AN EMPTY STOMACH 30 MINUTES PRIOR TO A MEAL) 09/17/20 09/29/22 Yes [provider]  traZODone (DESYREL) 50 MG tablet TAKE ONE TO TWO TABLETS BY MOUTH AT BEDTIME AS NEEDED FOR INSOMNIA 10/27/20 09/29/22 Yes [provider]    Allergies as of 09/02/2022 - Review Complete 12/14/2021  Allergen Reaction Noted   Cefazolin  03/21/2009   Sulfa antibiotics  03/21/2009   Zonisamide  03/21/2009    History reviewed. No pertinent family history.  Social History   Socioeconomic History   Marital status: Widowed    Spouse name: Not on file   Number of children: Not on file   Years of education: Not on file   Highest education level: Not on file  Occupational History   Not on file  Tobacco Use   Smoking status: Never   Smokeless tobacco: Never  Vaping Use  Vaping status: Never Used  Substance and Sexual Activity   Alcohol use: Yes    Comment: occasional   Drug use: Never   Sexual activity: Not on file  Other Topics Concern   Not on file  Social History Narrative   Not on file   Social Determinants of Health   Financial Resource Strain: Not on file  Food Insecurity: Not on file  Transportation Needs: Not on file  Physical Activity: Not on file  Stress: Not on file  Social Connections: Unknown (07/20/2022)   Received from [facility]   Social Network    Social Network: Not on file  Intimate Partner Violence: Not At Risk (07/20/2022)   Received from [facility]   HITS     Over the last 12 months how often did your partner physically hurt you?: 1    Over the last 12 months how often did your partner insult you or talk down to you?: 1    Over the last 12 months how often did your partner threaten you with physical harm?: 1    Over the last 12 months how often did your partner scream or curse at you?: 1    Review of Systems: See HPI, otherwise negative ROS  Physical Exam: Ht 5\' 1"  (1.549 m)   Wt 64.4 kg   BMI 26.83 kg/m  General:   Alert, cooperative in NAD Head:  Normocephalic and atraumatic. Respiratory:  Normal work of breathing. Cardiovascular:  RRR  Impression/Plan: Robin Zuniga is here for cataract surgery.  Risks, benefits, limitations, and alternatives regarding cataract surgery have been reviewed with the patient.  Questions have been answered.  All parties agreeable.   Estanislado Pandy, MD  10/07/2022, 11:11 AM

## 2022-10-09 ENCOUNTER — Encounter: Payer: Self-pay | Admitting: Ophthalmology

## 2022-10-13 ENCOUNTER — Encounter: Payer: Self-pay | Admitting: Ophthalmology

## 2022-10-19 NOTE — Discharge Instructions (Signed)

## 2022-10-21 ENCOUNTER — Other Ambulatory Visit: Payer: Self-pay

## 2022-10-21 ENCOUNTER — Ambulatory Visit
Admission: RE | Admit: 2022-10-21 | Discharge: 2022-10-21 | Disposition: A | Discharge status: Home / Self Care | Payer: No Typology Code available for payment source | Attending: Ophthalmology | Admitting: Ophthalmology

## 2022-10-21 ENCOUNTER — Encounter
Admission: RE | Disposition: A | Discharge status: Home / Self Care | Payer: Self-pay | Source: Home / Self Care | Attending: Ophthalmology

## 2022-10-21 ENCOUNTER — Ambulatory Visit: Payer: No Typology Code available for payment source | Admitting: Anesthesiology

## 2022-10-21 DIAGNOSIS — K219 Gastro-esophageal reflux disease without esophagitis: Secondary | ICD-10-CM | POA: Diagnosis not present

## 2022-10-21 DIAGNOSIS — Z853 Personal history of malignant neoplasm of breast: Secondary | ICD-10-CM | POA: Insufficient documentation

## 2022-10-21 DIAGNOSIS — I10 Essential (primary) hypertension: Secondary | ICD-10-CM | POA: Insufficient documentation

## 2022-10-21 DIAGNOSIS — H2511 Age-related nuclear cataract, right eye: Secondary | ICD-10-CM | POA: Insufficient documentation

## 2022-10-21 HISTORY — PX: CATARACT EXTRACTION W/PHACO: SHX586

## 2022-10-21 SURGERY — PHACOEMULSIFICATION, CATARACT, WITH IOL INSERTION
Anesthesia: Monitor Anesthesia Care | Site: Eye | Laterality: Right

## 2022-10-21 MED ORDER — BRIMONIDINE TARTRATE-TIMOLOL 0.2-0.5 % OP SOLN
OPHTHALMIC | Status: DC | PRN
  Administered 2022-10-21: 1 [drp] via OPHTHALMIC

## 2022-10-21 MED ORDER — SIGHTPATH DOSE#1 NA HYALUR & NA CHOND-NA HYALUR IO KIT
PACK | INTRAOCULAR | Status: DC | PRN
  Administered 2022-10-21: 1 via OPHTHALMIC

## 2022-10-21 MED ORDER — TETRACAINE HCL 0.5 % OP SOLN
1.0000 [drp] | OPHTHALMIC | Status: DC | PRN
  Administered 2022-10-21 (×3): 1 [drp] via OPHTHALMIC

## 2022-10-21 MED ORDER — MOXIFLOXACIN HCL 0.5 % OP SOLN
OPHTHALMIC | Status: DC | PRN
  Administered 2022-10-21: .2 mL via OPHTHALMIC

## 2022-10-21 MED ORDER — LIDOCAINE HCL (PF) 2 % IJ SOLN
INTRAOCULAR | Status: DC | PRN
  Administered 2022-10-21: 4 mL via INTRAOCULAR

## 2022-10-21 MED ORDER — LACTATED RINGERS IV SOLN: INTRAVENOUS | Status: DC

## 2022-10-21 MED ORDER — SIGHTPATH DOSE#1 BSS IO SOLN
INTRAOCULAR | Status: DC | PRN
  Administered 2022-10-21: 15 mL via INTRAOCULAR

## 2022-10-21 MED ORDER — FENTANYL CITRATE (PF) 100 MCG/2ML IJ SOLN
INTRAMUSCULAR | Status: DC | PRN
  Administered 2022-10-21: 50 ug via INTRAVENOUS

## 2022-10-21 MED ORDER — ARMC OPHTHALMIC DILATING DROPS
1.0000 | OPHTHALMIC | Status: DC | PRN
  Administered 2022-10-21 (×3): 1 via OPHTHALMIC

## 2022-10-21 MED ORDER — MIDAZOLAM HCL 2 MG/2ML IJ SOLN
INTRAMUSCULAR | Status: DC | PRN
  Administered 2022-10-21: 1 mg via INTRAVENOUS

## 2022-10-21 MED ORDER — SIGHTPATH DOSE#1 BSS IO SOLN
INTRAOCULAR | Status: DC | PRN
  Administered 2022-10-21: 80 mL via OPHTHALMIC

## 2022-10-21 SURGICAL SUPPLY — 11 items
CANNULA ANT/CHMB 27G (MISCELLANEOUS) IMPLANT
CANNULA ANT/CHMB 27GA (MISCELLANEOUS)
CATARACT SUITE SIGHTPATH (MISCELLANEOUS) ×1
DISSECTOR HYDRO NUCLEUS 50X22 (MISCELLANEOUS) ×2 IMPLANT
DRSG TEGADERM 2-3/8X2-3/4 SM (GAUZE/BANDAGES/DRESSINGS) ×2 IMPLANT
FEE CATARACT SUITE SIGHTPATH (MISCELLANEOUS) ×2 IMPLANT
GLOVE SURG SYN 7.5 E (GLOVE) ×1
GLOVE SURG SYN 7.5 PF PI (GLOVE) ×2 IMPLANT
GLOVE SURG SYN 8.5 E (GLOVE) ×1
GLOVE SURG SYN 8.5 PF PI (GLOVE) ×2 IMPLANT
LENS IOL TECNIS EYHANCE 19.0 (Intraocular Lens) IMPLANT

## 2022-10-21 NOTE — Transfer of Care (Signed)
Immediate Anesthesia Transfer of Care Note  Patient: Robin Zuniga [facility]  Procedure(s) Performed: CATARACT EXTRACTION PHACO AND INTRAOCULAR LENS PLACEMENT (IOC) RIGHT 5.80 00:43.9 (Right: Eye)  Patient Location: PACU  Anesthesia Type:MAC  Level of Consciousness: awake, alert , and oriented  Airway & Oxygen Therapy: Patient Spontanous Breathing  Post-op Assessment: Report given to RN, Post -op Vital signs reviewed and stable, and Patient moving all extremities X 4  Post vital signs: Reviewed and stable  Last Vitals:  Vitals Value Taken Time  BP    Temp    Pulse    Resp    SpO2      Last Pain:  Vitals:   10/21/22 0722  TempSrc: Temporal  PainSc: 0-No pain         Complications: No notable events documented.

## 2022-10-21 NOTE — Op Note (Signed)
OPERATIVE NOTE  Robin Zuniga [facility] [id number] 10/21/2022   PREOPERATIVE DIAGNOSIS: Nuclear sclerotic cataract right eye. H25.11   POSTOPERATIVE DIAGNOSIS: Nuclear sclerotic cataract right eye. H25.11   PROCEDURE:  Phacoemusification with posterior chamber intraocular lens placement of the right eye  Ultrasound time: Procedure(s): CATARACT EXTRACTION PHACO AND INTRAOCULAR LENS PLACEMENT (IOC) RIGHT 5.80 00:43.9 (Right)  LENS:   Implant Name Type Inv. Item Serial No. Manufacturer Lot No. LRB No. Used Action  LENS IOL TECNIS EYHANCE 19.0 - [id number] Intraocular Lens LENS IOL TECNIS EYHANCE 19.0 [id number] [employer]  Right 1 Implanted      SURGEON:  Julious Payer. Rolley Sims, MD   ANESTHESIA:  Topical with tetracaine drops, augmented with 1% preservative-free intracameral lidocaine.   COMPLICATIONS:  None.   DESCRIPTION OF PROCEDURE:  The patient was identified in the holding room and transported to the operating room and placed in the supine position under the operating microscope.  The right eye was identified as the operative eye, which was prepped and draped in the usual sterile ophthalmic fashion.   A 1 millimeter clear-corneal paracentesis was made superotemporally. Preservative-free 1% lidocaine mixed with 1:1,000 bisulfite-free aqueous solution of epinephrine was injected into the anterior chamber. The anterior chamber was then filled with Viscoat viscoelastic. A 2.4 millimeter keratome was used to make a clear-corneal incision inferotemporally. A curvilinear capsulorrhexis was made with a cystotome and capsulorrhexis forceps. Balanced salt solution was used to hydrodissect and hydrodelineate the nucleus. Phacoemulsification was then used to remove the lens nucleus and epinucleus. The remaining cortex was then removed using the irrigation and aspiration handpiece. Provisc was then placed into the capsular bag to distend it for lens placement. A +19.00 D DIB00 intraocular lens was then injected  into the capsular bag. The remaining viscoelastic was aspirated.   Wounds were hydrated with balanced salt solution.  The anterior chamber was inflated to a physiologic pressure with balanced salt solution.  No wound leaks were noted. Vigamox was injected intracamerally.  Timolol and Brimonidine drops were applied to the eye.  The patient was taken to the recovery room in stable condition without complications of anesthesia or surgery.  Robin Zuniga 10/21/2022, 9:03 AM

## 2022-10-21 NOTE — Anesthesia Preprocedure Evaluation (Signed)
Anesthesia Evaluation  Patient identified by MRN, date of birth, ID band Patient awake    Reviewed: Allergy & Precautions, H&P , NPO status , Patient's Chart, lab work & pertinent test results  Airway Mallampati: II  TM Distance: >3 FB Neck ROM: Full    Dental no notable dental hx.    Pulmonary neg pulmonary ROS   Pulmonary exam normal breath sounds clear to auscultation       Cardiovascular hypertension, Normal cardiovascular exam+ dysrhythmias  Rhythm:Regular Rate:Normal     Neuro/Psych  PSYCHIATRIC DISORDERS Anxiety Depression    negative neurological ROS     GI/Hepatic Neg liver ROS,GERD  ,,  Endo/Other  negative endocrine ROS    Renal/GU negative Renal ROS  negative genitourinary   Musculoskeletal negative musculoskeletal ROS (+)    Abdominal   Peds negative pediatric ROS (+)  Hematology negative hematology ROS (+)   Anesthesia Other Findings Depression  Anxiety Breast cancer (HCC)  Hypertension GERD (gastroesophageal reflux disease) Wears hearing aid in right ear RBBB (right bundle branch block)  Barrett's esophagus    Reproductive/Obstetrics negative OB ROS                             Anesthesia Physical Anesthesia Plan  ASA: 3  Anesthesia Plan: MAC   Post-op Pain Management:    Induction: Intravenous  PONV Risk Score and Plan:   Airway Management Planned: Natural Airway and Nasal Cannula  Additional Equipment:   Intra-op Plan:   Post-operative Plan:   Informed Consent: I have reviewed the patients History and Physical, chart, labs and discussed the procedure including the risks, benefits and alternatives for the proposed anesthesia with the patient or authorized representative who has indicated his/her understanding and acceptance.     Dental Advisory Given  Plan Discussed with: Anesthesiologist, CRNA and Surgeon  Anesthesia Plan Comments: (Patient  consented for risks of anesthesia including but not limited to:  - adverse reactions to medications - damage to eyes, teeth, lips or other oral mucosa - nerve damage due to positioning  - sore throat or hoarseness - Damage to heart, brain, nerves, lungs, other parts of body or loss of life  Patient voiced understanding.)        Anesthesia Quick Evaluation

## 2022-10-21 NOTE — H&P (Signed)
[facility]   Primary Care Physician:  [facility] [place] [facility] Ophthalmologist: Dr. Deberah Pelton  Pre-Procedure History & Physical: HPI:  Robin Zuniga is a 62 y.o. female here for cataract surgery.   Past Medical History:  Diagnosis Date   Anxiety    Barrett's esophagus    Breast cancer (HCC) 2006   Depression    GERD (gastroesophageal reflux disease)    Hypertension    RBBB (right bundle branch block)    Wears hearing aid in right ear     Past Surgical History:  Procedure Laterality Date   BREAST LUMPECTOMY Right    CATARACT EXTRACTION W/PHACO Left 10/07/2022   Procedure: CATARACT EXTRACTION PHACO AND INTRAOCULAR LENS PLACEMENT (IOC) LEFT  8.37  00:57.6;  Surgeon: Estanislado Pandy, MD;  [facility];  Service: Ophthalmology;  Laterality: Left;   CERVICAL SPINE SURGERY  2006   COLONOSCOPY WITH ESOPHAGOGASTRODUODENOSCOPY (EGD)  09/06/2022   [facility] Administration    Prior to Admission medications   Medication Sig Start Date End Date Taking? Authorizing Provider  acetaminophen (TYLENOL) 500 MG tablet Take 1,000 mg by mouth 2 (two) times daily.   Yes [provider]  amLODipine (NORVASC) 10 MG tablet Take 10 mg by mouth at bedtime.   Yes [provider]  ARIPiprazole (ABILIFY) 10 MG tablet TAKE ONE-HALF TABLET BY MOUTH DAILY FOR MOOD 05/13/20 10/13/22 Yes [provider]  buPROPion (WELLBUTRIN XL) 300 MG 24 hr tablet Take 1 tablet by mouth every morning. 05/19/20 10/13/22 Yes [provider]  busPIRone (BUSPAR) 10 MG tablet Take 10 mg by mouth 2 (two) times daily.   Yes [provider]  Carboxymethylcellulose Sodium 0.25 % SOLN INSTILL 1 DROP IN BOTH EYES FOUR TIMES A DAY AS NEEDED 08/01/20  Yes [provider]  carvedilol (COREG) 6.25 MG tablet Take 3.125 mg by mouth 2 (two) times daily with a meal.   Yes [provider]  cetirizine (ZYRTEC) 10 MG chewable tablet Chew 10 mg by mouth at bedtime  as needed for allergies.   Yes [provider]  famotidine (PEPCID) 20 MG tablet Take 20 mg by mouth 2 (two) times daily.   Yes [provider]  hydrOXYzine (ATARAX) 10 MG tablet Take 10 mg by mouth at bedtime as needed.   Yes [provider]  losartan (COZAAR) 25 MG tablet Take 1 tablet (25 mg total) by mouth in the morning and at bedtime. Patient taking differently: Take 100 mg by mouth in the morning and at bedtime. 12/14/21 10/13/22 Yes Concha Se, MD  MELATONIN-CHAMOMILE PO Take 4 mg by mouth at bedtime as needed.   Yes [provider]  omeprazole (PRILOSEC) 40 MG capsule TAKE ONE CAPSULE BY MOUTH TWICE A DAY (TAKE ON AN EMPTY STOMACH 30 MINUTES PRIOR TO A MEAL) 09/17/20 10/13/22 Yes [provider]  traZODone (DESYREL) 50 MG tablet TAKE ONE TO TWO TABLETS BY MOUTH AT BEDTIME AS NEEDED FOR INSOMNIA 10/27/20 10/13/22 Yes [provider]  Multiple Vitamin (MULTIVITAMIN) tablet Take 1 tablet by mouth daily.    [provider]  Omega-3 Fatty Acids (FISH OIL PO) Take by mouth daily.    [provider]    Allergies as of 09/02/2022 - Review Complete 12/14/2021  Allergen Reaction Noted   Cefazolin  03/21/2009   Sulfa antibiotics  03/21/2009   Zonisamide  03/21/2009    History reviewed. No pertinent family history.  Social History   Socioeconomic History   Marital  status: Widowed    Spouse name: Not on file   Number of children: Not on file   Years of education: Not on file   Highest education level: Not on file  Occupational History   Not on file  Tobacco Use   Smoking status: Never   Smokeless tobacco: Never  Vaping Use   Vaping status: Never Used  Substance and Sexual Activity   Alcohol use: Yes    Comment: occasional   Drug use: Never   Sexual activity: Not on file  Other Topics Concern   Not on file  Social History Narrative   Not on file   Social Determinants of Health   Financial Resource Strain: Not  on file  Food Insecurity: Not on file  Transportation Needs: Not on file  Physical Activity: Not on file  Stress: Not on file  Social Connections: Unknown (07/20/2022)   Received from [facility]   Social Network    Social Network: Not on file  Intimate Partner Violence: Not At Risk (07/20/2022)   Received from [facility]   HITS    Over the last 12 months how often did your partner physically hurt you?: 1    Over the last 12 months how often did your partner insult you or talk down to you?: 1    Over the last 12 months how often did your partner threaten you with physical harm?: 1    Over the last 12 months how often did your partner scream or curse at you?: 1    Review of Systems: See HPI, otherwise negative ROS  Physical Exam: Ht 5\' 1"  (1.549 m)   Wt 63.5 kg   BMI 26.45 kg/m  General:   Alert, cooperative in NAD Head:  Normocephalic and atraumatic. Respiratory:  Normal work of breathing. Cardiovascular:  RRR  Impression/Plan: Robin Zuniga is here for cataract surgery.  Risks, benefits, limitations, and alternatives regarding cataract surgery have been reviewed with the patient.  Questions have been answered.  All parties agreeable.   Estanislado Pandy, MD  10/21/2022, 7:10 AM

## 2022-10-21 NOTE — Anesthesia Postprocedure Evaluation (Signed)
Anesthesia Post Note  Patient: Robin Zuniga [facility]  Procedure(s) Performed: CATARACT EXTRACTION PHACO AND INTRAOCULAR LENS PLACEMENT (IOC) RIGHT 5.80 00:43.9 (Right: Eye)  Patient location during evaluation: PACU Anesthesia Type: MAC Level of consciousness: awake and alert Pain management: pain level controlled Vital Signs Assessment: post-procedure vital signs reviewed and stable Respiratory status: spontaneous breathing, nonlabored ventilation, respiratory function stable and patient connected to nasal cannula oxygen Cardiovascular status: blood pressure returned to baseline and stable Postop Assessment: no apparent nausea or vomiting Anesthetic complications: no  No notable events documented.   Last Vitals:  Vitals:   10/21/22 0905 10/21/22 0911  BP: 124/62 139/73  Pulse: 66 64  Resp: 18 11  Temp: (!) 36.2 C (!) 36.4 C  SpO2: 97% 99%    Last Pain:  Vitals:   10/21/22 0911  TempSrc:   PainSc: 0-No pain                 Stephanie Coup

## 2022-10-22 ENCOUNTER — Encounter: Payer: Self-pay | Admitting: Ophthalmology

## 2023-02-19 ENCOUNTER — Emergency Department
Admission: EM | Admit: 2023-02-19 | Discharge: 2023-02-20 | Disposition: A | Discharge status: Home / Self Care | Payer: No Typology Code available for payment source | Attending: Emergency Medicine | Admitting: Emergency Medicine

## 2023-02-19 ENCOUNTER — Other Ambulatory Visit: Payer: Self-pay

## 2023-02-19 DIAGNOSIS — R112 Nausea with vomiting, unspecified: Secondary | ICD-10-CM | POA: Diagnosis present

## 2023-02-19 DIAGNOSIS — I10 Essential (primary) hypertension: Secondary | ICD-10-CM | POA: Diagnosis not present

## 2023-02-19 DIAGNOSIS — D72829 Elevated white blood cell count, unspecified: Secondary | ICD-10-CM | POA: Diagnosis not present

## 2023-02-19 DIAGNOSIS — K529 Noninfective gastroenteritis and colitis, unspecified: Secondary | ICD-10-CM | POA: Diagnosis not present

## 2023-02-19 LAB — COMPREHENSIVE METABOLIC PANEL
ALT: 17 U/L (ref 0–44)
AST: 15 U/L (ref 15–41)
Albumin: 3.9 g/dL (ref 3.5–5.0)
Alkaline Phosphatase: 104 U/L (ref 38–126)
Anion gap: 6 (ref 5–15)
BUN: 17 mg/dL (ref 8–23)
CO2: 28 mmol/L (ref 22–32)
Calcium: 9 mg/dL (ref 8.9–10.3)
Chloride: 101 mmol/L (ref 98–111)
Creatinine, Ser: 0.67 mg/dL (ref 0.44–1.00)
GFR, Estimated: >60 mL/min (ref >60)
Glucose, Bld: 117 mg/dL — ABNORMAL HIGH (ref 70–99)
Potassium: 4.2 mmol/L (ref 3.5–5.1)
Sodium: 135 mmol/L (ref 135–145)
Total Bilirubin: 0.4 mg/dL (ref 0.0–1.2)
Total Protein: 6.9 g/dL (ref 6.5–8.1)

## 2023-02-19 LAB — TROPONIN I (HIGH SENSITIVITY): Troponin I (High Sensitivity): 4 ng/L (ref <18)

## 2023-02-19 LAB — CBC WITH DIFFERENTIAL/PLATELET
Abs Immature Granulocytes: 0.05 10*3/uL (ref 0.00–0.07)
Basophils Absolute: 0 10*3/uL (ref 0.0–0.1)
Basophils Relative: 0 %
Eosinophils Absolute: 0 10*3/uL (ref 0.0–0.5)
Eosinophils Relative: 0 %
HCT: 37.6 % (ref 36.0–46.0)
Hemoglobin: 12.9 g/dL (ref 12.0–15.0)
Immature Granulocytes: 0 %
Lymphocytes Relative: 8 %
Lymphs Abs: 1 10*3/uL (ref 0.7–4.0)
MCH: 29.1 pg (ref 26.0–34.0)
MCHC: 34.3 g/dL (ref 30.0–36.0)
MCV: 84.7 fL (ref 80.0–100.0)
Monocytes Absolute: 0.5 10*3/uL (ref 0.1–1.0)
Monocytes Relative: 4 %
Neutro Abs: 10.6 10*3/uL — ABNORMAL HIGH (ref 1.7–7.7)
Neutrophils Relative %: 88 %
Platelets: 205 10*3/uL (ref 150–400)
RBC: 4.44 MIL/uL (ref 3.87–5.11)
RDW: 12.3 % (ref 11.5–15.5)
WBC: 12.2 10*3/uL — ABNORMAL HIGH (ref 4.0–10.5)
nRBC: 0 % (ref 0.0–0.2)

## 2023-02-19 MED ORDER — ONDANSETRON 4 MG PO TBDP
4.0000 mg | ORAL_TABLET | Freq: Once | ORAL | Status: AC
  Administered 2023-02-20: 4 mg via ORAL
  Filled 2023-02-19: qty 1

## 2023-02-19 NOTE — ED Provider Triage Note (Signed)
 Emergency Medicine Provider Triage Evaluation Note  Robin Zuniga , a 63 y.o. female  was evaluated in triage.  Pt complains of nausea and vomiting since about 1am. No diarrhea or fever.  Physical Exam  There were no vitals taken for this visit. Gen:   Awake, no distress   Resp:  Normal effort  MSK:   Moves extremities without difficulty  Other:    Medical Decision Making  Medically screening exam initiated at 2:59 PM.  Appropriate orders placed.  Robin Zuniga was informed that the remainder of the evaluation will be completed by another provider, this initial triage assessment does not replace that evaluation, and the importance of remaining in the ED until their evaluation is complete.    Robin Kirk NOVAK, FNP 02/19/23 1500

## 2023-02-19 NOTE — ED Triage Notes (Signed)
 Patient comes in via pov with complaints of emesis. Pt states that she had some chicken nuggets from a restaurant, and has been vomiting ever since. Pt has no reports of diarrhea, but does complain of dizziness. Pt is alert and oriented x4, and with no signs of acute distress at this time.

## 2023-02-19 NOTE — ED Provider Notes (Signed)
 [facility] Provider Note    Event Date/Time   First MD Initiated Contact with Patient 02/19/23 2349     (approximate)   History   Emesis   HPI  Robin Zuniga is a 63 y.o. female with history of hypertension who comes in with concerns for nausea, vomiting.  Patient reports eating some chicken nuggets from a restaurant and has been vomiting ever since.  Denies any diarrhea.  Does report a little bit of dizziness.  This has been ongoing since 1 AM.  Patient reports that she took some Zofran  at home but last dose was over 8 hours ago.  She reports that even with Zofran  she still felt nauseous.  She states that she feels hungry now.  Denies any abdominal pain.  Denies any chest pain. Physical Exam   Triage Vital Signs: ED Triage Vitals  Encounter Vitals Group     BP 02/19/23 1459 (!) 144/78     Systolic BP Percentile --      Diastolic BP Percentile --      Pulse Rate 02/19/23 1459 70     Resp 02/19/23 1459 18     Temp 02/19/23 1459 98.2 F (36.8 C)     Temp Source 02/19/23 1459 Oral     SpO2 02/19/23 1459 100 %     Weight 02/19/23 1502 140 lb (63.5 kg)     Height 02/19/23 1502 5' 1 (1.549 m)     Head Circumference --      Peak Flow --      Pain Score 02/19/23 1501 0     Pain Loc --      Pain Education --      Exclude from Growth Chart --     Most recent vital signs: Vitals:   02/19/23 1459 02/19/23 2040  BP: (!) 144/78 129/84  Pulse: 70 68  Resp: 18 18  Temp: 98.2 F (36.8 C) 98.7 F (37.1 C)  SpO2: 100% 96%     General: Awake, no distress.  CV:  Good peripheral perfusion.  Resp:  Normal effort.  Abd:  No distention.  Soft and nontender Other:     ED Results / Procedures / Treatments   Labs (all labs ordered are listed, but only abnormal results are displayed) Labs Reviewed  COMPREHENSIVE METABOLIC PANEL - Abnormal; Notable for the following components:      Result Value   Glucose, Bld 117 (*)    All other components within  normal limits  CBC WITH DIFFERENTIAL/PLATELET - Abnormal; Notable for the following components:   WBC 12.2 (*)    Neutro Abs 10.6 (*)    All other components within normal limits  TROPONIN I (HIGH SENSITIVITY)     EKG  My interpretation of EKG:  Sinus rate of 60 without any ST elevation, T wave inversions, incomplete right bundle branch block  PROCEDURES:  Critical Care performed: No  Procedures   MEDICATIONS ORDERED IN ED: Medications  ondansetron  (ZOFRAN -ODT) disintegrating tablet 4 mg (4 mg Oral Given 02/20/23 0005)     IMPRESSION / MDM / ASSESSMENT AND PLAN / ED COURSE  I reviewed the triage vital signs and the nursing notes.   Patient's presentation is most consistent with acute presentation with potential threat to life or bodily function.   Suspect related to what patient, food borne illness..  Abdomen is soft and nontender low suspicion for acute pathology.  Labs ordered evaluate for Electra abnormalities, AKI  Troponin was negative and  symptoms have been ongoing since 1 AM for greater than 3 hours.  CMP reassuring CBC shows slightly elevated white count  Patient denies any urinary symptoms.  We discussed that even though patient may be mildly dehydrated no significant signs of severe dehydration based on vitals, blood work.  We discussed p.o. challenge after Zofran  and they were agreeable to this  PT tolerated PO. NO UTI symptoms.   The patient is on the cardiac monitor to evaluate for evidence of arrhythmia and/or significant heart rate changes.      FINAL CLINICAL IMPRESSION(S) / ED DIAGNOSES   Final diagnoses:  Enteritis     Rx / DC Orders   ED Discharge Orders          Ordered    ondansetron  (ZOFRAN -ODT) 4 MG disintegrating tablet  Every 8 hours PRN        02/20/23 0008             Note:  This document was prepared using Dragon voice recognition software and may include unintentional dictation errors.   Ernest Ronal BRAVO, MD 02/20/23  [phone]

## 2023-02-20 MED ORDER — ONDANSETRON 4 MG PO TBDP: 4.0000 mg | ORAL_TABLET | Freq: Three times a day (TID) | ORAL | 0 refills | Status: AC | PRN

## 2023-02-20 NOTE — Discharge Instructions (Signed)
 Workup was reassuring.  I suspect this was related to what you ate.  Your abdomen exam was reassuring.  If you develop severe lower abdominal pain, fevers please return to the ER for repeat evaluation.  Stay well-hydrated with Pedialyte.

## 2024-03-15 ENCOUNTER — Other Ambulatory Visit: Payer: Self-pay

## 2024-03-15 ENCOUNTER — Emergency Department
Admission: EM | Admit: 2024-03-15 | Discharge: 2024-03-15 | Disposition: A | Discharge status: Home / Self Care | Source: Home / Self Care

## 2024-03-15 ENCOUNTER — Emergency Department

## 2024-03-15 DIAGNOSIS — R112 Nausea with vomiting, unspecified: Secondary | ICD-10-CM

## 2024-03-15 DIAGNOSIS — N3000 Acute cystitis without hematuria: Secondary | ICD-10-CM

## 2024-03-15 LAB — COMPREHENSIVE METABOLIC PANEL WITH GFR
ALT: 21 U/L (ref 0–44)
AST: 18 U/L (ref 15–41)
Albumin: 4.4 g/dL (ref 3.5–5.0)
Alkaline Phosphatase: 122 U/L (ref 38–126)
Anion gap: 12 (ref 5–15)
BUN: 24 mg/dL — ABNORMAL HIGH (ref 8–23)
CO2: 25 mmol/L (ref 22–32)
Calcium: 9.1 mg/dL (ref 8.9–10.3)
Chloride: 102 mmol/L (ref 98–111)
Creatinine, Ser: 0.75 mg/dL (ref 0.44–1.00)
GFR, Estimated: >60 mL/min
Glucose, Bld: 134 mg/dL — ABNORMAL HIGH (ref 70–99)
Potassium: 4.4 mmol/L (ref 3.5–5.1)
Sodium: 138 mmol/L (ref 135–145)
Total Bilirubin: 0.4 mg/dL (ref 0.0–1.2)
Total Protein: 7.2 g/dL (ref 6.5–8.1)

## 2024-03-15 LAB — CBC
HCT: 38.2 % (ref 36.0–46.0)
Hemoglobin: 12.4 g/dL (ref 12.0–15.0)
MCH: 26.5 pg (ref 26.0–34.0)
MCHC: 32.5 g/dL (ref 30.0–36.0)
MCV: 81.6 fL (ref 80.0–100.0)
Platelets: 240 10*3/uL (ref 150–400)
RBC: 4.68 MIL/uL (ref 3.87–5.11)
RDW: 13.8 % (ref 11.5–15.5)
WBC: 19 10*3/uL — ABNORMAL HIGH (ref 4.0–10.5)
nRBC: 0 % (ref 0.0–0.2)

## 2024-03-15 LAB — URINALYSIS, ROUTINE W REFLEX MICROSCOPIC
Bilirubin Urine: NEGATIVE
Glucose, UA: NEGATIVE mg/dL
Hgb urine dipstick: NEGATIVE
Ketones, ur: NEGATIVE mg/dL
Nitrite: POSITIVE — AB
Protein, ur: NEGATIVE mg/dL
Specific Gravity, Urine: >1.046 — ABNORMAL HIGH (ref 1.005–1.030)
pH: 6 (ref 5.0–8.0)

## 2024-03-15 LAB — TYPE AND SCREEN
ABO/RH(D): A NEG
Antibody Screen: NEGATIVE

## 2024-03-15 MED ORDER — NITROFURANTOIN MONOHYD MACRO 100 MG PO CAPS: 100.0000 mg | ORAL_CAPSULE | Freq: Two times a day (BID) | ORAL | 0 refills | Status: AC

## 2024-03-15 MED ORDER — PANTOPRAZOLE SODIUM 40 MG IV SOLR
40.0000 mg | Freq: Once | INTRAVENOUS | Status: AC
  Administered 2024-03-15: 40 mg via INTRAVENOUS
  Filled 2024-03-15: qty 10

## 2024-03-15 MED ORDER — IOHEXOL 300 MG/ML  SOLN
100.0000 mL | Freq: Once | INTRAMUSCULAR | Status: AC | PRN
  Administered 2024-03-15: 100 mL via INTRAVENOUS

## 2024-03-15 NOTE — ED Triage Notes (Signed)
 4 mg Zofran  ODT given last night (from son's RX. )Patient states helped nausea

## 2024-03-15 NOTE — ED Triage Notes (Signed)
 Pt via POV from home. Pt c/o dizziness for the couple of days. Reports some coffee ground emesis since this AM. Denies blood thinners. Pt is A&OX4 and NAD

## 2024-03-15 NOTE — Discharge Instructions (Signed)
 Please follow-up with the [facility] to schedule an endoscopy for further evaluation of your symptoms.

## 2024-03-15 NOTE — ED Provider Notes (Signed)
 "  [facility] Provider Note    Event Date/Time   First MD Initiated Contact with Patient 03/15/24 1232     (approximate)   History   Hematemesis   HPI  Robin Zuniga is a 64 y.o. female with past medical history of Barrett's esophagus, hypertension, GERD, presenting to the emergency department complaining of dizziness for the last few days.  Patient reports that this morning she had an episode of coffee-ground emesis and so decided to come to the ER for evaluation.  She states that she took a Zofran  ODT last night which helped with her nausea.  She reports pain in her upper abdomen.  She does not take any blood thinners.  She leaves her last endoscopy was in 2024.     Physical Exam   Triage Vital Signs: ED Triage Vitals  Encounter Vitals Group     BP 03/15/24 1035 130/67     Girls Systolic BP Percentile --      Girls Diastolic BP Percentile --      Boys Systolic BP Percentile --      Boys Diastolic BP Percentile --      Pulse Rate 03/15/24 1035 87     Resp 03/15/24 1035 16     Temp 03/15/24 1035 98.6 F (37 C)     Temp Source 03/15/24 1035 Oral     SpO2 03/15/24 1035 98 %     Weight 03/15/24 1036 139 lb 15.9 oz (63.5 kg)     Height --      Head Circumference --      Peak Flow --      Pain Score 03/15/24 1036 5     Pain Loc --      Pain Education --      Exclude from Growth Chart --     Most recent vital signs: Vitals:   03/15/24 1035  BP: 130/67  Pulse: 87  Resp: 16  Temp: 98.6 F (37 C)  SpO2: 98%     General: Awake, no distress.  CV:  Good peripheral perfusion.  Resp:  Normal effort.  Abd:  No distention.  Other:     ED Results / Procedures / Treatments   Labs (all labs ordered are listed, but only abnormal results are displayed) Labs Reviewed  COMPREHENSIVE METABOLIC PANEL WITH GFR - Abnormal; Notable for the following components:      Result Value   Glucose, Bld 134 (*)    BUN 24 (*)    All other components within  normal limits  CBC - Abnormal; Notable for the following components:   WBC 19.0 (*)    All other components within normal limits  POC OCCULT BLOOD, ED  TYPE AND SCREEN     EKG     RADIOLOGY CT abdomen/pelvis: IMPRESSION:  1. Small punctate locule of gas nondependently within the bladder lumen, may  represent changes of recent instrumentation or catheterization. Correlation  with urinalysis recommended to exclude acute cystitis.  2. Small ovoid lesion partially visualized in the lower right breast measuring  1.5 cm with a couple of adjacent surgical clips. This may represent a post  operative seroma or chronic hematoma. Non-emergent follow up with breast  imaging recommended.      PROCEDURES:  Critical Care performed: No  Procedures   MEDICATIONS ORDERED IN ED: Medications  pantoprazole  (PROTONIX ) injection 40 mg (has no administration in time range)     IMPRESSION / MDM / ASSESSMENT AND PLAN /  ED COURSE  I reviewed the triage vital signs and the nursing notes.                              Differential diagnosis includes, but is not limited to, biliary disease (biliary colic, acute cholecystitis, cholangitis, choledocholithiasis, etc), intrathoracic causes for epigastric abdominal pain including ACS, gastritis, duodenitis, pancreatitis, small bowel or large bowel obstruction, abdominal aortic aneurysm, hernia, and ulcer(s).   Patient's presentation is most consistent with acute presentation with potential threat to life or bodily function.  Patient is a 64 year old female e with past medical history of Barrett's esophagus, hypertension, GERD, presenting to the emergency department complaining of dizziness for the last few days.  Her blood work was overall unremarkable although she did have an elevated white blood cell count.  Hemoglobin and hematocrit were stable.  CT of the abdomen was performed which was overall unremarkable though did show findings concerning for  acute cystitis.  A urinalysis was performed and was positive for nitrates and bacteria.  I discussed results with the patient at length.  Discussed plan for treatment of her UTI with Macrobid .  Discussed need for follow-up for an endoscopy for further evaluation.  We also discussed return precautions at length.  She will return immediately to the emergency department for new or worsening symptoms.      FINAL CLINICAL IMPRESSION(S) / ED DIAGNOSES   Final diagnoses:  Acute cystitis without hematuria  Nausea and vomiting, unspecified vomiting type     Rx / DC Orders   ED Discharge Orders     None        Note:  This document was prepared using Dragon voice recognition software and may include unintentional dictation errors.   Rexford Reche HERO, MD 03/15/24 1601  "
